# Patient Record
Sex: Male | Born: 1970 | Race: White | Hispanic: No | Smoking: Current every day smoker
Health system: Southern US, Community
[De-identification: ages and names within clinical notes are randomized; demographics above are authoritative.]

## PROBLEM LIST (undated history)

## (undated) DIAGNOSIS — I639 Cerebral infarction, unspecified: Secondary | ICD-10-CM

---

## 2013-12-02 ENCOUNTER — Emergency Department (HOSPITAL_BASED_OUTPATIENT_CLINIC_OR_DEPARTMENT_OTHER)
Admission: EM | Admit: 2013-12-02 | Discharge: 2013-12-02 | Disposition: A | Discharge status: Home / Self Care | Payer: Self-pay | Attending: Emergency Medicine | Admitting: Emergency Medicine

## 2013-12-02 ENCOUNTER — Encounter (HOSPITAL_BASED_OUTPATIENT_CLINIC_OR_DEPARTMENT_OTHER): Payer: Self-pay | Admitting: Emergency Medicine

## 2013-12-02 DIAGNOSIS — T162XXA Foreign body in left ear, initial encounter: Secondary | ICD-10-CM

## 2013-12-02 DIAGNOSIS — T169XXA Foreign body in ear, unspecified ear, initial encounter: Secondary | ICD-10-CM | POA: Insufficient documentation

## 2013-12-02 DIAGNOSIS — IMO0002 Reserved for concepts with insufficient information to code with codable children: Secondary | ICD-10-CM | POA: Insufficient documentation

## 2013-12-02 DIAGNOSIS — F17200 Nicotine dependence, unspecified, uncomplicated: Secondary | ICD-10-CM | POA: Insufficient documentation

## 2013-12-02 DIAGNOSIS — Y929 Unspecified place or not applicable: Secondary | ICD-10-CM | POA: Insufficient documentation

## 2013-12-02 DIAGNOSIS — Y939 Activity, unspecified: Secondary | ICD-10-CM | POA: Insufficient documentation

## 2013-12-02 NOTE — ED Notes (Signed)
Pt reports not hearing since this morning. Black ear bud seen in ear.

## 2013-12-02 NOTE — ED Provider Notes (Signed)
CSN: [id number]     Arrival date & time 12/02/13  1807 History   First MD Initiated Contact with Patient 12/02/13 1813     Chief Complaint  Patient presents with  . Foreign Body in Ear     (Consider location/radiation/quality/duration/timing/severity/associated sxs/prior Treatment) Patient is a 43 y.o. male presenting with foreign body in ear. The history is provided by the patient. No language interpreter was used.  Foreign Body in Ear This is a new problem. The current episode started today. The problem occurs constantly. Pertinent negatives include no headaches. Nothing aggravates the symptoms. He has tried nothing for the symptoms. The treatment provided no relief.  Pt has plastic ear bud from earbud speakers stuck in ear  History reviewed. No pertinent past medical history. History reviewed. No pertinent past surgical history. No family history on file. History  Substance Use Topics  . Smoking status: Current Every Day Smoker  . Smokeless tobacco: Not on file  . Alcohol Use: No    Review of Systems  HENT: Positive for ear pain.   Neurological: Negative for headaches.  All other systems reviewed and are negative.     Allergies  Review of patient's allergies indicates no known allergies.  Home Medications   Prior to Admission medications   Not on File   There were no vitals taken for this visit. Physical Exam  Nursing note and vitals reviewed. Constitutional: He is oriented to person, place, and time. He appears well-developed and well-nourished.  HENT:  Head: Normocephalic.  Black plastic in left ear canal  Eyes: EOM are normal. Pupils are equal, round, and reactive to light.  Neck: Normal range of motion.  Cardiovascular: Normal rate.   Pulmonary/Chest: Effort normal.  Abdominal: He exhibits no distension.  Musculoskeletal: Normal range of motion.  Neurological: He is alert and oriented to person, place, and time.  Psychiatric: He has a normal mood and  affect.    ED Course  Procedures (including critical care time) Labs Review Labs Reviewed - No data to display  Imaging Review No results found.   EKG Interpretation None      MDM foreign body removed with forcep   Final diagnoses:  Foreign body in ear, left, initial encounter        Elson AreasLeslie K Sofia, PA-C 12/02/13 1833

## 2013-12-02 NOTE — ED Notes (Signed)
Foreign object removed from left ear, tolerated well.

## 2013-12-02 NOTE — Discharge Instructions (Signed)
Ear Foreign Body °An ear foreign body is an object that is stuck in the ear. It is common for young children to put objects into the ear canal. These may include pebbles, beads, beans, and any other small objects which will fit. In adults, objects such as cotton swabs may become lodged in the ear canal. In all ages, the most common foreign bodies are insects that enter the ear canal.  °SYMPTOMS  °Foreign bodies may cause pain, buzzing or roaring sounds, hearing loss, and ear drainage.  °HOME CARE INSTRUCTIONS  °· Keep all follow-up appointments with your caregiver as told. °· Keep small objects out of reach of young children. Tell them not to put anything in their ears. °SEEK IMMEDIATE MEDICAL CARE IF:  °· You have bleeding from the ear. °· You have increased pain or swelling of the ear. °· You have reduced hearing. °· You have discharge coming from the ear. °· You have a fever. °· You have a headache. °MAKE SURE YOU:  °· Understand these instructions. °· Will watch your condition. °· Will get help right away if you are not doing well or get worse. °Document Released: 04/21/2000 Document Revised: 07/17/2011 Document Reviewed: 12/11/2007 °ExitCare® Patient Information ©2015 [employer]. This information is not intended to replace advice given to you by your health care provider. Make sure you discuss any questions you have with your health care provider. ° °

## 2013-12-03 NOTE — ED Provider Notes (Signed)
Medical screening examination/treatment/procedure(s) were performed by non-physician practitioner and as supervising physician I was immediately available for consultation/collaboration.   EKG Interpretation None        Rosmery Duggin S Shaquira Moroz, MD 12/03/13 1032 

## 2015-02-06 ENCOUNTER — Emergency Department (HOSPITAL_BASED_OUTPATIENT_CLINIC_OR_DEPARTMENT_OTHER)
Admission: EM | Admit: 2015-02-06 | Discharge: 2015-02-06 | Disposition: A | Discharge status: Home / Self Care | Payer: Self-pay | Attending: Emergency Medicine | Admitting: Emergency Medicine

## 2015-02-06 ENCOUNTER — Encounter (HOSPITAL_BASED_OUTPATIENT_CLINIC_OR_DEPARTMENT_OTHER): Payer: Self-pay | Admitting: Emergency Medicine

## 2015-02-06 DIAGNOSIS — Z7982 Long term (current) use of aspirin: Secondary | ICD-10-CM | POA: Insufficient documentation

## 2015-02-06 DIAGNOSIS — Z720 Tobacco use: Secondary | ICD-10-CM | POA: Insufficient documentation

## 2015-02-06 DIAGNOSIS — K029 Dental caries, unspecified: Secondary | ICD-10-CM | POA: Insufficient documentation

## 2015-02-06 DIAGNOSIS — Z8673 Personal history of transient ischemic attack (TIA), and cerebral infarction without residual deficits: Secondary | ICD-10-CM | POA: Insufficient documentation

## 2015-02-06 HISTORY — DX: Cerebral infarction, unspecified: I63.9

## 2015-02-06 MED ORDER — HYDROCODONE-ACETAMINOPHEN 5-325 MG PO TABS: 1.0000 | ORAL_TABLET | Freq: Four times a day (QID) | ORAL | Status: AC | PRN

## 2015-02-06 MED ORDER — PENICILLIN V POTASSIUM 500 MG PO TABS: 500.0000 mg | ORAL_TABLET | Freq: Three times a day (TID) | ORAL | Status: AC

## 2015-02-06 NOTE — ED Provider Notes (Signed)
CSN: [id number]     Arrival date & time 02/06/15  0010 History   First MD Initiated Contact with Patient 02/06/15 0132     Chief Complaint  Patient presents with  . Facial Swelling     (Consider location/radiation/quality/duration/timing/severity/associated sxs/prior Treatment) Patient is a 44 y.o. male presenting with tooth pain. The history is provided by the patient.  Dental Pain Location:  Upper Upper teeth location:  14/LU 1st molar, 15/LU 2nd molar and 16/LU 3rd molar Quality:  Throbbing Severity:  Severe Onset quality:  Sudden Duration:  2 days Timing:  Constant Progression:  Worsening Chronicity:  New Relieved by:  Nothing Worsened by:  Nothing tried   Past Medical History  Diagnosis Date  . Stroke [facility])    History reviewed. No pertinent past surgical history. No family history on file. Social History  Substance Use Topics  . Smoking status: Current Every Day Smoker -- 0.50 packs/day  . Smokeless tobacco: None  . Alcohol Use: No    Review of Systems  All other systems reviewed and are negative.     Allergies  Review of patient's allergies indicates no known allergies.  Home Medications   Prior to Admission medications   Medication Sig Start Date End Date Taking? Authorizing Provider  aspirin 325 MG tablet Take 325 mg by mouth daily.   Yes Historical Provider, MD   BP 128/77 mmHg  Pulse 61  Temp(Src) 98 F (36.7 C) (Oral)  Resp 16  Ht  (1.854 m)  Wt 280 lb (127.007 kg)  BMI 36.95 kg/m2  SpO2 99% Physical Exam  Constitutional: He appears well-developed and well-nourished. No distress.  HENT:  Head: Normocephalic and atraumatic.  Patient has multiple caries throughout. The left upper molars are decayed. There is some surrounding gingival inflammation, however no obvious abscess. There is tenderness throughout the cheek and left jaw, however there is no obvious abscess. There is no crepitus or swelling of the submental space.  Neck: Normal  range of motion. Neck supple.  Skin: Skin is warm and dry. He is not diaphoretic.  Nursing note and vitals reviewed.   ED Course  Procedures (including critical care time) Labs Review Labs Reviewed - No data to display  Imaging Review No results found. I have personally reviewed and evaluated these images and lab results as part of my medical decision-making.   EKG Interpretation None      MDM   Final diagnoses:  None    We'll treat with antibiotics and pain medication. He is to follow-up with dentistry on Monday.    Geoffery Lyons, MD 02/06/15 [phone]

## 2015-02-06 NOTE — ED Notes (Signed)
Pt verbalizes understanding of d/c instructions and denies any further needs at this time. 

## 2015-02-06 NOTE — ED Notes (Signed)
Bad tooth on left upper.  Has dental appt next week but this evening his jaw and left side of neck started swelling.

## 2015-02-06 NOTE — Discharge Instructions (Signed)
Penicillin as prescribed.  Hydrocodone as prescribed as needed for pain.  Follow-up with dentistry next week.   Dental Pain A tooth ache may be caused by cavities (tooth decay). Cavities expose the nerve of the tooth to air and hot or cold temperatures. It may come from an infection or abscess (also called a boil or furuncle) around your tooth. It is also often caused by dental caries (tooth decay). This causes the pain you are having. DIAGNOSIS  Your caregiver can diagnose this problem by exam. TREATMENT   If caused by an infection, it may be treated with medications which kill germs (antibiotics) and pain medications as prescribed by your caregiver. Take medications as directed.  Only take over-the-counter or prescription medicines for pain, discomfort, or fever as directed by your caregiver.  Whether the tooth ache today is caused by infection or dental disease, you should see your dentist as soon as possible for further care. SEEK MEDICAL CARE IF: The exam and treatment you received today has been provided on an emergency basis only. This is not a substitute for complete medical or dental care. If your problem worsens or new problems (symptoms) appear, and you are unable to meet with your dentist, call or return to this location. SEEK IMMEDIATE MEDICAL CARE IF:   You have a fever.  You develop redness and swelling of your face, jaw, or neck.  You are unable to open your mouth.  You have severe pain uncontrolled by pain medicine. MAKE SURE YOU:   Understand these instructions.  Will watch your condition.  Will get help right away if you are not doing well or get worse. Document Released: 04/24/2005 Document Revised: 07/17/2011 Document Reviewed: 12/11/2007 [facility] Patient Information 2015 [place], [place]. This information is not intended to replace advice given to you by your health care provider. Make sure you discuss any questions you have with your health care  provider.    Emergency Department Resource Guide 1) Find a Doctor and Pay Out of Pocket Although you won't have to find out who is covered by your insurance plan, it is a good idea to ask around and get recommendations. You will then need to call the office and see if the doctor you have chosen will accept you as a new patient and what types of options they offer for patients who are self-pay. Some doctors offer discounts or will set up payment plans for their patients who do not have insurance, but you will need to ask so you aren't surprised when you get to your appointment.  2) Contact Your Local Health Department Not all health departments have doctors that can see patients for sick visits, but many do, so it is worth a call to see if yours does. If you don't know where your local health department is, you can check in your phone book. The CDC also has a tool to help you locate your state's health department, and many state websites also have listings of all of their local health departments.  3) Find a Walk-in Clinic If your illness is not likely to be very severe or complicated, you may want to try a walk in clinic. These are popping up all over the country in pharmacies, drugstores, and shopping centers. They're usually staffed by nurse practitioners or physician assistants that have been trained to treat common illnesses and complaints. They're usually fairly quick and inexpensive. However, if you have serious medical issues or chronic medical problems, these are probably not your best  option.  No Primary Care Doctor: - Call Health Connect at  [phone] - they can help you locate a primary care doctor that  accepts your insurance, provides certain services, etc. - Physician Referral Service- [phone]  Chronic Pain Problems: Organization         Address  Phone   Notes  [facility]  [phone] Patients need to be referred by their primary care doctor.    Medication Assistance: Organization         Address  Phone   Notes  [facility] [place] [place], [place] [address] [phone] --Must be a resident of [place] -- Must have NO insurance coverage whatsoever (no Medicaid/ Medicare, etc.) -- The pt. MUST have a primary care doctor that directs their care regularly and follows them in the community   [facility]ist  [phone]   [employer]  [phone]    Agencies that provide inexpensive medical care: Organization         Address  Phone   Notes  [place]  [phone]   [facility]    [phone]   [facility] [place], [place] [address] [phone]   [place] [address], [place] [phone]   Planned Parenthood    8106361137   [facility]    [phone]   [place] and [place] [address], [place] Phone:  [phone], Fax:  [phone] Hours of Operation:  9 am - 6 pm, M-F.  Also accepts Medicaid/Medicare and self-pay.  [facility] for [place], [address], [place] Phone: [phone], Fax: [phone]. Hours of Operation:  8:30 am - 5:30 pm, M-F.  Also accepts Medicaid and self-pay.  [facility] [place] [address] [place] Phone: [phone]   [place], [place], [place] [phone], Ext. 123 Mondays & Thursdays: 7-9 AM.  First 15 patients are seen on a first come, first serve basis.    [place] Providers:  Organization         Address  Phone   Notes  [facility] [address], [place] [phone] Also accepts self-pay patients.  [facility] [address], [place]  [phone]   [place], [address], [place] [phone]   [facility] [address], [place] [phone]   Lucianne Lei [address], [place]   [phone] Only accepts [place] patients after they have their name applied to their card.   Self-Pay (no insurance) in [facility]:  Organization         Address  Phone   Notes  Sickle Cell Patients, [facility] [place] [phone]   [facility] Urgent Care [place] [phone]   [facility] [place]  [place], [place], [place] [phone]   [facility]/Dr. Osei-Bonsu  [address], [place] [address], [place] [phone] Phone number for both [place] and [place] locations is the same.  Urgent Medical and [facility] [address], [place] (  336) [phone]   [facility] [address] [place] or [address] [phone]   [facility] [address], [place] [phone], phone; [phone], fax Sees patients 1st and 3rd Saturday of every month.  Must not qualify for public or private insurance (i.e. Medicaid, Medicare, [place] Health Choice, Veterans' Benefits)  Household income should be no more than 200% of the poverty level The clinic cannot treat you if you are pregnant or think you are pregnant  Sexually transmitted diseases are not treated at the clinic.    Dental Care: Organization         Address  Phone  Notes  [facility] Department of [facility] [address] [place], [place] [phone] Accepts children up to age 92 who are enrolled in [place] or [place] Health Choice; pregnant women with a Medicaid card; and children who have applied for Medicaid or [place] Health Choice, but were declined, whose parents can pay a reduced fee at time of service.   [facility] Department of [facility]  [address], [place] [phone] Accepts children up to age 5 who are enrolled in [place] or [place] Health Choice; pregnant women with a Medicaid card; and children who have applied for Medicaid or [place] Health Choice, but were declined, whose parents can pay a reduced fee at time of service.  [facility]  [address] [place], [place] [phone] Patients are seen by appointment only. Walk-ins are not accepted. [facility] will see patients 22 years of age and older. Monday - Tuesday (8am-5pm) Most Wednesdays (8:30-5pm) $30 per visit, cash only  [facility] Access [facility]  [address], [facility] [phone] Patients are seen by appointment only. Walk-ins are not accepted. [facility] will see patients 24 years of age and older. One Wednesday Evening (Monthly: Volunteer Based).  $30 per visit, cash only  [employer]  [phone] for adults; Children under age 4, call Graduate Pediatric Dentistry at [phone]. Children aged 94-14, please call [phone] to request a pediatric application.  Dental services are provided in all areas of dental care including fillings, crowns and bridges, complete and partial dentures, implants, gum treatment, root canals, and extractions. Preventive care is also provided. Treatment is provided to both adults and children. Patients are selected via a lottery and there is often a waiting list.   [facility] [address] [place]  [phone] [email or web address]   [facility] [address] [place], [place] [phone], Ext. [phone] Second and Fourth Thursday of each month, opens at 6:30 AM; Clinic ends at 9 AM.  Patients are seen on a first-come first-served basis, and a limited number are seen during each clinic.   [facility]  [address] [place], [place] [phone]   Eligibility Requirements You must have lived in [place], [place], or [place] counties for at least the last three months.   You cannot be eligible for state or federal sponsored [place], including [employer], [place], or [employer].   You generally cannot be eligible for healthcare insurance through your employer.    How to apply: Eligibility screenings are held every Tuesday and Wednesday afternoon from 1:00 pm until 4:00 pm. You do not need an appointment for the interview!  [facility] [address], [place],  [place] [phone]   [facility] Health Department  [phone]   [facility] Health Department  [phone]   [facility] Health Department  [phone]    Behavioral Health Resources in the Community: Intensive Outpatient Programs Organization         Address  Phone  Notes  [facility] Services [address], [place], [place] [phone]   [facility] Outpatient [address] [place], [place] [phone]   [facility] [address], [place], [place]  [phone]   [facility] Mental Health [address]  [place], [place] [phone] or [phone]   Substance Abuse Resources Organization         Address  Phone  Notes  Al[facility] Drug Services  [phone]   [facility]  [phone]   [facility] [place]  [phone]   Floydene Flock  [phone]   Residential & Outpatient Substance Abuse Program  [phone]   Psychological Services Organization         Address  Phone  Notes  [facility] Behavioral Health  [phone]   [facility] Services  [phone]   [facility] Mental Health [address], [place] [phone] or [phone]    Mobile Crisis Teams Organization         Address  Phone  Notes  [facility], Mobile Crisis Care Unit  [phone]   [facility]  [address] [place], [place] [phone]   Doristine Locks [address] [place] [phone]    Self-Help/Support Groups Organization         Address  Phone             Notes  [facility] [place] - variety of support groups  336- I7437963 Call for more information  Narcotics Anonymous (NA), [facility] [address], [employer] [place]  2 meetings at this location   Statistician         Address  Phone  Notes  [facility] [address],    [place]  [phone]   [facility]  [address] [place] [address], [place], [place] [phone]   [facility] [address] [place], [place] [phone] Admissions: 8am-3pm M-F  [facility] [address],    [place], [place] [phone]   [facility] [address] [place], [place], [place] [phone]   [facility] [address],  [place], [place] [phone]   [facility] - Intensive Outpatient [address], [place], [place] [phone]   [facility] [address] [place]  [place], [place] [phone] or [phone]   [facility] ([facility]) [address], [place], [place] [phone] Accepts Medicaid  [facility] [place] [address]  [place] [phone] Substance Abuse/Addiction Treatment   [facility] Organization         Address  Phone  Notes  [facility]  [phone]   Angie Fava, PhD [address] [place], [place]   [phone] or 9194679688   [facility] Behavioral   [address] [place], [place] [phone]   [facility] [address] [place], [place] [phone] Insurance/Medicaid/sponsorship through [employer] and Families [address]  [place], [place] [phone]  [place], [place] [phone]    Dr. Adele Schilder  [phone]   [facility] Dept. 1) [address], [place] 2) [place] 3)  [place] [address], [place] [phone]  [phone]   [place] [phone] or [phone] (After Hours)

## 2016-02-24 ENCOUNTER — Encounter (HOSPITAL_BASED_OUTPATIENT_CLINIC_OR_DEPARTMENT_OTHER): Payer: Self-pay | Admitting: *Deleted

## 2016-02-24 ENCOUNTER — Emergency Department (HOSPITAL_BASED_OUTPATIENT_CLINIC_OR_DEPARTMENT_OTHER)
Admission: EM | Admit: 2016-02-24 | Discharge: 2016-02-24 | Disposition: A | Discharge status: Home / Self Care | Payer: Self-pay | Attending: Emergency Medicine | Admitting: Emergency Medicine

## 2016-02-24 ENCOUNTER — Emergency Department (HOSPITAL_BASED_OUTPATIENT_CLINIC_OR_DEPARTMENT_OTHER): Payer: Self-pay

## 2016-02-24 DIAGNOSIS — M25562 Pain in left knee: Secondary | ICD-10-CM | POA: Insufficient documentation

## 2016-02-24 DIAGNOSIS — Z7982 Long term (current) use of aspirin: Secondary | ICD-10-CM | POA: Insufficient documentation

## 2016-02-24 DIAGNOSIS — F17200 Nicotine dependence, unspecified, uncomplicated: Secondary | ICD-10-CM | POA: Insufficient documentation

## 2016-02-24 LAB — D-DIMER, QUANTITATIVE: D-Dimer, Quant: 0.28 ug/mL-FEU (ref 0.00–0.50)

## 2016-02-24 MED ORDER — NAPROXEN 375 MG PO TABS: 375.0000 mg | ORAL_TABLET | Freq: Two times a day (BID) | ORAL | 0 refills | Status: AC

## 2016-02-24 MED ORDER — NAPROXEN 375 MG PO TABS: 375.0000 mg | ORAL_TABLET | Freq: Two times a day (BID) | ORAL | 0 refills | Status: DC

## 2016-02-24 MED ORDER — OXYCODONE-ACETAMINOPHEN 5-325 MG PO TABS
1.0000 | ORAL_TABLET | Freq: Once | ORAL | Status: AC
  Administered 2016-02-24: 1 via ORAL
  Filled 2016-02-24: qty 1

## 2016-02-24 NOTE — ED Provider Notes (Signed)
[facility] Provider Note   CSN: [id number] Arrival date & time: 02/24/16  1631  By signing my name below, I, Emmanuella Mensah, attest that this documentation has been prepared under the direction and in the presence of Demetrios Loll, PA-C. Electronically Signed: Angelene Giovanni, ED Scribe. 02/24/16. 5:11 PM.   History   Chief Complaint Chief Complaint  Patient presents with  . Knee Pain    HPI Comments: Jay King is a 45 y.o. male with a hx of stroke (5 years ago) who presents to the Emergency Department complaining of gradually worsening moderate left knee pain with swelling onset 4 days ago. He notes that his pain began in the popliteal region but now the pain includes his anterior knee as well. He states that the pain is worse with ambulating but pain is maximal when he gets out of bed in the morning. No alleviating factors noted. Pt has not tried any medications PTA. He denies any known falls, injuries, or trauma but reports that he lifts a lot of boxes at work. He also denies any similar past symptoms or a hx of arthritis. He states that he is a current smoker. He denies any recent long car/plane trips, immobilizations, or surgeries. He also denies any hx of PE/DVT. Pt has NKDA. He denies any fever, chills, calf pain, numbness/tingling in BLE, weakness, chest pain, shortness of breath, color changes, open wounds, or any other symptoms.   The history is provided by the patient. No language interpreter was used.    Past Medical History:  Diagnosis Date  . Stroke [facility])     There are no active problems to display for this patient.   History reviewed. No pertinent surgical history.     Home Medications    Prior to Admission medications   Medication Sig Start Date End Date Taking? Authorizing Provider  aspirin 325 MG tablet Take 325 mg by mouth daily.   Yes Historical Provider, MD  HYDROcodone-acetaminophen (NORCO) 5-325 MG tablet Take 1-2 tablets by mouth  every 6 (six) hours as needed. 02/06/15   Geoffery Lyons, MD  penicillin v potassium (VEETID) 500 MG tablet Take 1 tablet (500 mg total) by mouth 3 (three) times daily. 02/06/15   Geoffery Lyons, MD    Family History No family history on file.  Social History Social History  Substance Use Topics  . Smoking status: Current Every Day Smoker    Packs/day: 0.50  . Smokeless tobacco: Never Used  . Alcohol use No     Allergies   Review of patient's allergies indicates no known allergies.   Review of Systems Review of Systems  Constitutional: Negative for chills and fever.  HENT: Negative for congestion and rhinorrhea.   Respiratory: Negative for shortness of breath.   Cardiovascular: Negative for chest pain.  Gastrointestinal: Negative for abdominal pain.  Musculoskeletal: Positive for arthralgias and joint swelling.  Skin: Negative for color change and wound.  Neurological: Negative for syncope, weakness, numbness and headaches.  All other systems reviewed and are negative.    Physical Exam Updated Vital Signs BP 120/81   Pulse 84   Temp 98.6 F (37 C) (Oral)   Resp 20   Ht 6\' 1"  (1.854 m)   Wt 280 lb (127 kg)   SpO2 98%   BMI 36.94 kg/m   Physical Exam  Constitutional: He is oriented to person, place, and time. He appears well-developed and well-nourished.  HENT:  Head: Normocephalic and atraumatic.  Eyes: EOM are normal.  Neck: Normal range of motion.  Cardiovascular: Normal rate, regular rhythm, normal heart sounds and intact distal pulses.   Pulmonary/Chest: Effort normal and breath sounds normal. No respiratory distress.  Abdominal: Soft. He exhibits no distension. There is no tenderness.  Musculoskeletal: Normal range of motion.       Left knee: He exhibits normal range of motion, no swelling, no effusion, no ecchymosis, no deformity, no erythema, no LCL laxity and no MCL laxity. Tenderness found. Medial joint line and patellar tendon tenderness noted.  Patient  with full rom of left knee. DP pulses are 2+ bilat. Sensation intact. Cap refill normal. Tenderness to the medial joint line, over the patellar tendon and in the popliteal fossa. No effusion noted. No laxity of the mcl, lcl, acl, or pcl. Mcmurray test ellicit pain without any clicking heard.  Neurological: He is alert and oriented to person, place, and time.  Skin: Skin is warm and dry.  Psychiatric: He has a normal mood and affect. Judgment normal.  Nursing note and vitals reviewed.    ED Treatments / Results  DIAGNOSTIC STUDIES: Oxygen Saturation is 98% on RA, normal by my interpretation.    COORDINATION OF CARE: 5:11 PM- Pt advised of plan for treatment and pt agrees. Pt will receive left knee x-ray for further evaluation. Will provide knee brace and resources for Ortho follow up.    Labs (all labs ordered are listed, but only abnormal results are displayed) Labs Reviewed  D-DIMER, QUANTITATIVE (NOT AT [facility])    EKG  EKG Interpretation None    [facility]   Radiology Dg Knee Complete 4 Views Left  Result Date: 02/24/2016 CLINICAL DATA:  Acute onset of left knee pain, and swelling about the patella. Initial encounter. EXAM: LEFT KNEE - COMPLETE 4+ VIEW COMPARISON:  None. FINDINGS: There is no evidence of fracture or dislocation. The joint spaces are preserved. No significant degenerative change is seen; the patellofemoral joint is grossly unremarkable in appearance. No significant joint effusion is seen. The visualized soft tissues are normal in appearance. IMPRESSION: No evidence of fracture or dislocation. Electronically Signed   By: Roanna RaiderJeffery  Chang M.D.   On: 02/24/2016 18:04    Procedures Procedures (including critical care time)  Medications Ordered in ED Medications - No data to display   Initial Impression / Assessment and Plan / ED Course  Demetrios LollKenneth Julien Oscar, PA-C has reviewed the triage vital signs and the nursing notes.  Pertinent labs & imaging results that were available  during my care of the patient were reviewed by me and considered in my medical decision making (see chart for details).  Clinical Course  MDM Number of Diagnoses or Management Options Acute pain of left knee:    Patient X-Ray negative for obvious fracture or dislocation. Pain managed in ED. D dimer was negative low suspicion for DVT. Patient is with low risk factors. Pain is worse in the morning and with overuse at work. This is likely osteoarthritis. Patient is given a knee sleeve and crutches. Pain managed in the ED.  Pt advised to follow up with orthopedics if symptoms persist for possibility of missed fracture diagnosis. Patient given brace while in ED, conservative therapy recommended and discussed. Patient will be dc home & is agreeable with above plan. Strict return precautions given. Discussed cased with Dr. Clayborne DanaMesner who is in agreeance. Patient verbalized understanding with plan of care.   Final Clinical Impressions(s) / ED Diagnoses   Final diagnoses:  Acute pain of left knee  New Prescriptions Discharge Medication List as of 02/24/2016  8:00 PM     I personally performed the services described in this documentation, which was scribed in my presence. The recorded information has been reviewed and is accurate.    Rise Mu, PA-C 02/25/16 1015    Marily Memos, MD 02/25/16 [phone]

## 2016-02-24 NOTE — ED Notes (Signed)
Pt verbalizes understanding of d/c instructions and denies any further need at this time. 

## 2016-02-24 NOTE — Discharge Instructions (Signed)
Your x-ray has been negative today.Your blood work has been negative. You may take the naproxen and Tylenol for pain. I would alternate ice and heat.Use crutches as needed.Follow up with her primary doctor for further orthopedic workup.

## 2016-02-24 NOTE — ED Triage Notes (Signed)
Left knee pain for a week. Worse in the mornings when he gets out of bed.

## 2016-07-15 ENCOUNTER — Emergency Department (HOSPITAL_COMMUNITY)
Admission: EM | Admit: 2016-07-15 | Discharge: 2016-07-16 | Disposition: A | Discharge status: Home / Self Care | Payer: Self-pay | Attending: Dermatology | Admitting: Dermatology

## 2016-07-15 DIAGNOSIS — R112 Nausea with vomiting, unspecified: Secondary | ICD-10-CM | POA: Insufficient documentation

## 2016-07-15 DIAGNOSIS — Z5321 Procedure and treatment not carried out due to patient leaving prior to being seen by health care provider: Secondary | ICD-10-CM | POA: Insufficient documentation

## 2016-07-16 ENCOUNTER — Encounter (HOSPITAL_COMMUNITY): Payer: Self-pay | Admitting: Emergency Medicine

## 2016-07-16 NOTE — ED Notes (Addendum)
Since today, pt c/o nausea with diarrhea x4 and vomiting x3, chest pain when he is coughing, headache 7/10. He took Robitussin cough and cold about three hours ago Pt has a hx of CVA 4-5 years ago.

## 2016-07-16 NOTE — ED Notes (Signed)
Pt advised registration that he was leaving.

## 2016-07-16 NOTE — ED Triage Notes (Signed)
Pt reports having flu symptoms along with vomiting and diarrhea for the last 3 days. Pt reports headache and but able to keep liquids down.

## 2018-03-01 IMAGING — DX DG KNEE COMPLETE 4+V*L*
5 series · 5 of 5 positions shown · non-contrast
Comparison: None.

CLINICAL DATA: Acute onset of left knee pain, and swelling about
the patella. Initial encounter.

EXAM:
LEFT KNEE - COMPLETE 4+ VIEW

[knee ap]
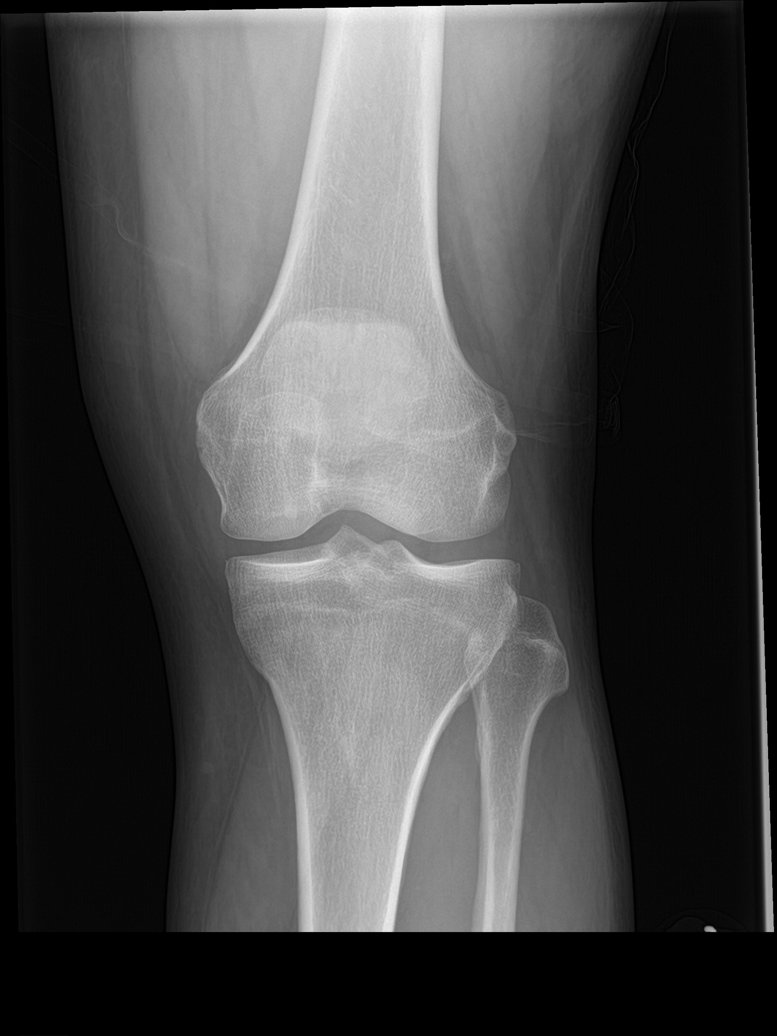

[knee lat (1 of 2)]
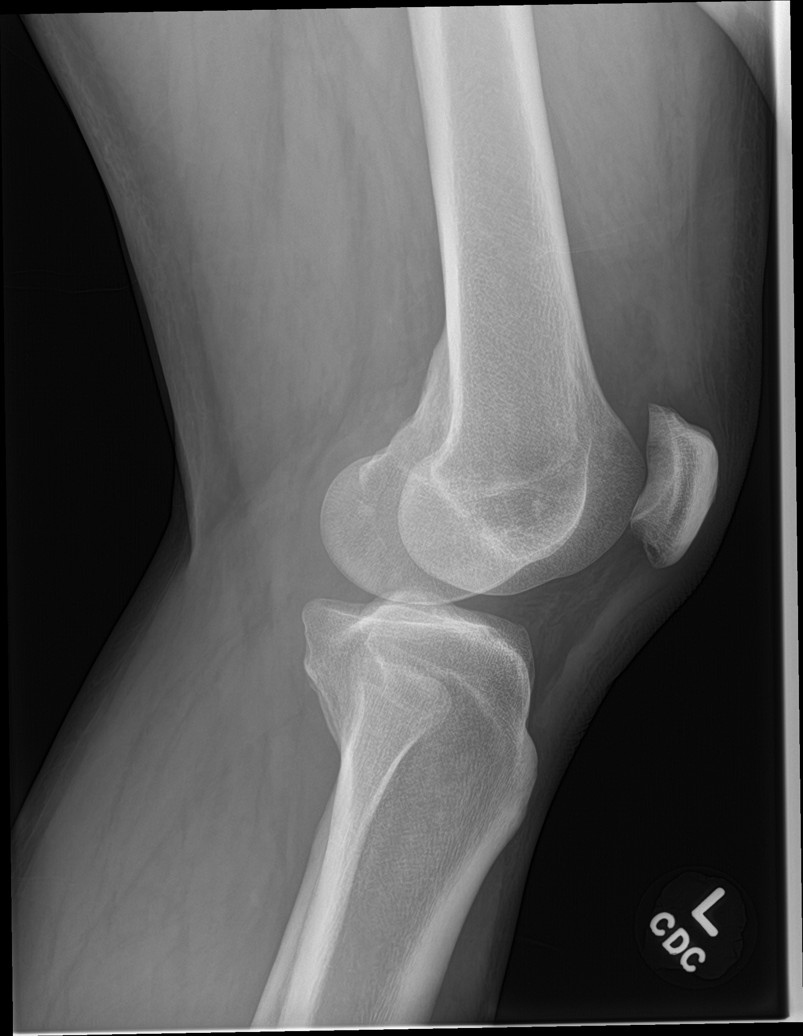

[knee obl (1 of 2)]
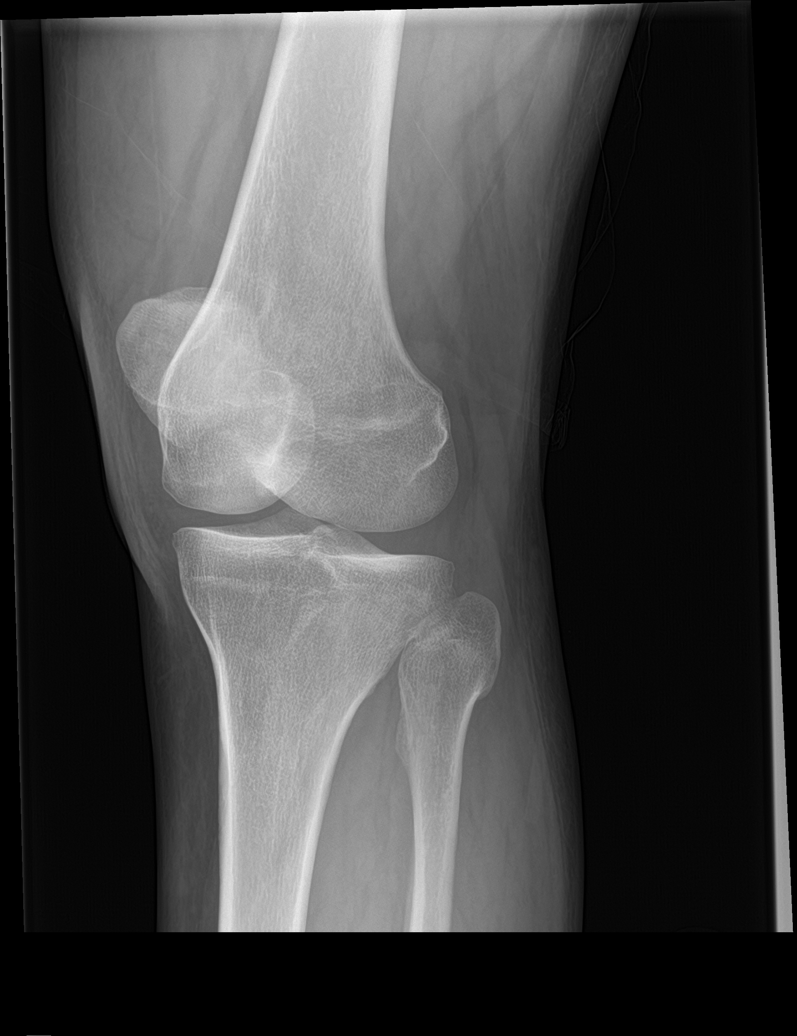

[knee obl (2 of 2)]
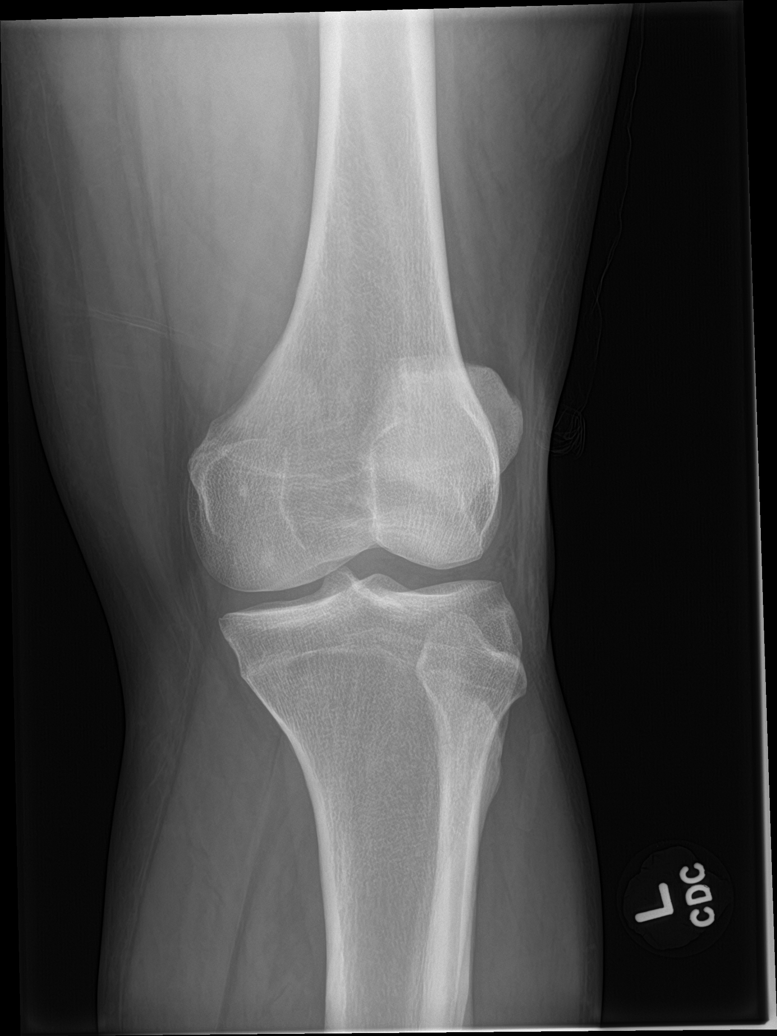

[knee lat (2 of 2)]
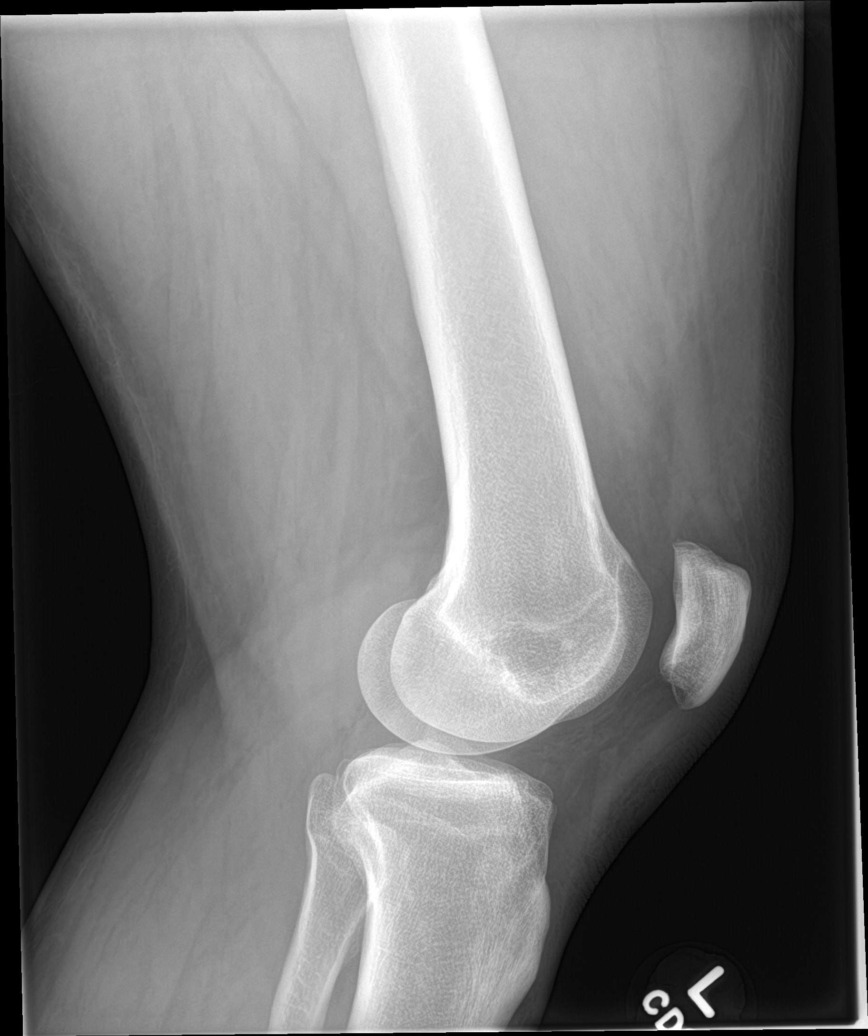

[5 of 5 positions shown; findings below may reference images not displayed]

FINDINGS: There is no evidence of fracture or dislocation. The joint spaces
are preserved. No significant degenerative change is seen; the
patellofemoral joint is grossly unremarkable in appearance.

No significant joint effusion is seen. The visualized soft tissues
are normal in appearance.
IMPRESSION: No evidence of fracture or dislocation.
